# Patient Record
Sex: Male | Born: 1945 | Race: White | Hispanic: No | Marital: Married | Smoking: Former smoker
Health system: Southern US, Community
[De-identification: ages and names within clinical notes are randomized; demographics above are authoritative.]

## PROBLEM LIST (undated history)

## (undated) DIAGNOSIS — I739 Peripheral vascular disease, unspecified: Secondary | ICD-10-CM

## (undated) DIAGNOSIS — G35 Multiple sclerosis: Secondary | ICD-10-CM

## (undated) DIAGNOSIS — J449 Chronic obstructive pulmonary disease, unspecified: Secondary | ICD-10-CM

## (undated) HISTORY — DX: Multiple sclerosis: G35

## (undated) HISTORY — DX: Peripheral vascular disease, unspecified: I73.9

## (undated) HISTORY — DX: Chronic obstructive pulmonary disease, unspecified: J44.9

---

## 2016-08-02 ENCOUNTER — Institutional Professional Consult (permissible substitution): Payer: Self-pay | Admitting: Pulmonary Disease

## 2016-08-11 ENCOUNTER — Encounter: Payer: Self-pay | Admitting: Pulmonary Disease

## 2016-08-11 ENCOUNTER — Ambulatory Visit (INDEPENDENT_AMBULATORY_CARE_PROVIDER_SITE_OTHER)
Admission: RE | Admit: 2016-08-11 | Discharge: 2016-08-11 | Disposition: A | Discharge status: Home / Self Care | Payer: Medicare PPO | Source: Ambulatory Visit | Attending: Pulmonary Disease | Admitting: Pulmonary Disease

## 2016-08-11 ENCOUNTER — Ambulatory Visit (INDEPENDENT_AMBULATORY_CARE_PROVIDER_SITE_OTHER): Payer: Medicare PPO | Admitting: Pulmonary Disease

## 2016-08-11 VITALS — BP 122/64 | HR 148 | Ht 68.0 in | Wt 203.6 lb

## 2016-08-11 DIAGNOSIS — R0602 Shortness of breath: Secondary | ICD-10-CM

## 2016-08-11 DIAGNOSIS — J449 Chronic obstructive pulmonary disease, unspecified: Secondary | ICD-10-CM | POA: Diagnosis not present

## 2016-08-11 DIAGNOSIS — F17200 Nicotine dependence, unspecified, uncomplicated: Secondary | ICD-10-CM

## 2016-08-11 NOTE — Progress Notes (Signed)
David Saunders    [id number]    29-Jan-1946  Primary Care Physician:David Saunders David Dredge, MD  Referring Physician: Abigail Miyamoto, MD [id number] [place], [place] [address]  Chief complaint:  Consult for evaluation of dyspnea  HPI: 71 year old with history of multiple sclerosis, COPD, chronic lung disease. He has been maintained on Symbicort and Spiriva for past several years with stable symptoms of dyspnea on exertion. He denies dyspnea at rest. No cough, sputum production or wheeze. He's never been seen by pulmonologist and does not recall getting pulmonary function tests. Was put on supplemental oxygen at night but is not using this often.  He has history of multiple sclerosis with 1-2 exacerbations every year. He is on copaxone for for this and follows at [facility]. 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 He is an active smoker and continues to smoke half pack per day.  Pets: None, allergic to cats Occupation: Used to work as a Forensic scientist Exposures: Exposure to [employer], nonspecific to asbestos Smoking history: Continues to smoke half pack per day. About 50-pack-year smoking history  Outpatient Encounter Prescriptions as of 08/11/2016  Medication Sig  . albuterol (PROAIR HFA) 108 (90 Base) MCG/ACT inhaler inhale 2 puffs every 6 hours if needed  . buPROPion (WELLBUTRIN XL) 150 MG 24 hr tablet   . Cholecalciferol (VITAMIN D3) 5000 units TABS Take by mouth.  Molli Hazard Acetate (COPAXONE) 40 MG/ML SOSY inject 40 milligrams UNDER THE SKIN 3 TIMES A WEEK  . levocetirizine (XYZAL) 5 MG tablet   . LORazepam (ATIVAN) 1 MG tablet Take 1 pill about 30-40 minutes before MRI, may repeat x 1  . predniSONE (DELTASONE) 5 MG tablet   . SYMBICORT 160-4.5 MCG/ACT inhaler   . tamsulosin (FLOMAX) 0.4 MG CAPS capsule   . Tiotropium Bromide Monohydrate (SPIRIVA RESPIMAT) 2.5 MCG/ACT AERS   . traZODone (DESYREL) 50 MG tablet  Take 150 mg by mouth.   No facility-administered encounter medications on file as of 08/11/2016.     Allergies as of 08/11/2016  . (Not on File)    Past Medical History:  Diagnosis Date  . COPD (chronic obstructive pulmonary disease) (HCC)   . MS (multiple sclerosis) (HCC)     History reviewed. No pertinent surgical history.  Family History  Problem Relation Age of Onset  . Cancer Mother     Social History   Social History  . Marital status: Married    Spouse name: N/A  . Number of children: N/A  . Years of education: N/A   Occupational History  . Not on file.   Social History Main Topics  . Smoking status: Current Every Day Smoker    Packs/day: 0.25    Years: 50.00    Types: Cigarettes  . Smokeless tobacco: Never Used  . Alcohol use No  . Drug use: No  . Sexual activity: Not on file   Other Topics Concern  . Not on file   Social History Narrative  . No narrative on file    Review of systems: Review of Systems  Constitutional: Negative for fever and chills.  HENT: Negative.   Eyes: Negative for blurred vision.  Respiratory: as per HPI  Cardiovascular: Negative for chest pain and palpitations.  Gastrointestinal: Negative for vomiting, diarrhea, blood per rectum. Genitourinary: Negative for dysuria, urgency, frequency and hematuria.  Musculoskeletal: Negative for myalgias, back pain and  joint pain.  Skin: Negative for itching and rash.  Neurological: Negative for dizziness, tremors, focal weakness, seizures and loss of consciousness.  Endo/Heme/Allergies: Negative for environmental allergies.  Psychiatric/Behavioral: Negative for depression, suicidal ideas and hallucinations.  All other systems reviewed and are negative.  Physical Exam: Blood pressure 122/64, pulse (!) 148, height 5\' 8"  (1.727 m), weight 203 lb 9.6 oz (92.4 kg), SpO2 (!) 89 %. Gen:      No acute distress HEENT:  EOMI, sclera anicteric Neck:     No masses; no thyromegaly Lungs:     Reduced air entry; normal respiratory effort CV:         Regular rate and rhythm; no murmurs Abd:      + bowel sounds; soft, non-tender; no palpable masses, no distension Ext:    No edema; adequate peripheral perfusion Skin:      Warm and dry; no rash Neuro: alert and oriented x 3 Psych: normal mood and affect  Data Reviewed: CBC 08/05/16-WBC count 8.6, eosinophils 2%, absolute eosinophil count 0.1 Metabolic panel, LFTs 08/05/16- within normal limits  Assessment:  Eval for COPD.  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 Will evaluate with getting CXR and pulmonary function tests. He'll continue on the Symbicort and Spiriva for now We discussed supplemental oxygen during exertion but he is reluctant to start . Need to address this at next visit  Multiple sclerosis Stable on Copaxone. He is concerned about diaphragm involvement with with this. Can be evaluated by chest x-ray and PFTs  Active smoker Stressed importance of quitting. He will wants to try and quit on his own. He'll be referred for low-dose screening CTs of the chest.  Plan/Recommendations: - Continue symbicort, spiriva - PFTs, CXR - Low dose screening CT of chest.  David Greathouse MD [facility] Pager [phone] 08/11/2016, 4:13 PM  CC: David Saunders

## 2016-08-11 NOTE — Patient Instructions (Addendum)
We will get a chest x-ray today Continue on Symbicort, Spiriva and albuterol rescue inhaler We will get pulmonary function test for further evaluation of lung function Referral for low-dose screening CTs of the chest  Return to clinic in 3 months

## 2016-08-15 ENCOUNTER — Telehealth: Payer: Self-pay | Admitting: Pulmonary Disease

## 2016-08-15 DIAGNOSIS — J60 Coalworker's pneumoconiosis: Secondary | ICD-10-CM

## 2016-08-15 DIAGNOSIS — B399 Histoplasmosis, unspecified: Secondary | ICD-10-CM

## 2016-08-15 DIAGNOSIS — J449 Chronic obstructive pulmonary disease, unspecified: Secondary | ICD-10-CM

## 2016-08-15 NOTE — Telephone Encounter (Signed)
Pt aware of results and voiced his understanding. 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 Pt ask that I make PM aware before ordering CT, however he would agree to CT if PM wants to proceed with CT.   PM please advise. Thanks.   Notes recorded by Velvet Bathe, CMA on 08/12/2016 at 3:07 PM EDT lmtcb X1 for pt. Will order HRCT after speaking to pt. ------  Notes recorded by Chilton Greathouse, MD on 08/12/2016 at 5:58 AM EDT Please let the patient know that the CXR shows COPD and maybe scarring and lung spots. Please order high res CT.For indication use COPD, coal workers pneumoconiosis

## 2016-08-15 NOTE — Telephone Encounter (Signed)
Pt calling once again, said he was returning call for Morrie Sheldon? Please advise.Caren Griffins

## 2016-08-16 NOTE — Telephone Encounter (Signed)
Spoke with pt, aware of PM's recs.  CT ordered as requested.  Nothing further needed.

## 2016-08-16 NOTE — Telephone Encounter (Signed)
Yes. Please proceed with high res CT. It would allow [place] to assess the histoplasmosis as well. Please add histoplasmosis for the ordering indication in addition to COPD and coal workers pneumoconiosis.  Thanks PM

## 2016-08-23 ENCOUNTER — Ambulatory Visit (HOSPITAL_COMMUNITY)
Admission: RE | Admit: 2016-08-23 | Discharge: 2016-08-23 | Disposition: A | Discharge status: Home / Self Care | Payer: Medicare PPO | Source: Ambulatory Visit | Attending: Pulmonary Disease | Admitting: Pulmonary Disease

## 2016-08-23 DIAGNOSIS — J60 Coalworker's pneumoconiosis: Secondary | ICD-10-CM | POA: Insufficient documentation

## 2016-08-23 DIAGNOSIS — R590 Localized enlarged lymph nodes: Secondary | ICD-10-CM | POA: Insufficient documentation

## 2016-08-23 DIAGNOSIS — J449 Chronic obstructive pulmonary disease, unspecified: Secondary | ICD-10-CM | POA: Diagnosis not present

## 2016-08-23 DIAGNOSIS — B399 Histoplasmosis, unspecified: Secondary | ICD-10-CM | POA: Insufficient documentation

## 2016-08-23 DIAGNOSIS — I2510 Atherosclerotic heart disease of native coronary artery without angina pectoris: Secondary | ICD-10-CM | POA: Insufficient documentation

## 2016-08-23 DIAGNOSIS — I700 Atherosclerosis of aorta: Secondary | ICD-10-CM | POA: Diagnosis not present

## 2016-08-23 DIAGNOSIS — J432 Centrilobular emphysema: Secondary | ICD-10-CM | POA: Diagnosis not present

## 2016-08-29 ENCOUNTER — Telehealth: Payer: Self-pay

## 2016-08-29 NOTE — Telephone Encounter (Signed)
Created in error

## 2016-09-13 ENCOUNTER — Telehealth: Payer: Self-pay | Admitting: Pulmonary Disease

## 2016-09-13 NOTE — Telephone Encounter (Signed)
Routing to lung nodule pool for appropriate follow-up.

## 2016-09-13 NOTE — Telephone Encounter (Signed)
No need for screening CT this year. We can cancel order We will out in order at next visit to start screening in 2019.   PM

## 2016-09-13 NOTE — Telephone Encounter (Signed)
Dr Isaiah Serge,  You had sent an order to start pt in lung screening but then pt had a HRCT on 08/23/16.  Please advise if you still want pt followed for lung cancer screening.

## 2016-09-15 NOTE — Telephone Encounter (Signed)
Referral cancelled.  Nothing further needed.

## 2016-12-06 ENCOUNTER — Ambulatory Visit: Payer: Medicare PPO | Admitting: Pulmonary Disease

## 2017-03-09 DIAGNOSIS — G35 Multiple sclerosis: Secondary | ICD-10-CM | POA: Diagnosis not present

## 2017-03-09 DIAGNOSIS — J441 Chronic obstructive pulmonary disease with (acute) exacerbation: Secondary | ICD-10-CM | POA: Diagnosis not present

## 2017-03-09 DIAGNOSIS — J9690 Respiratory failure, unspecified, unspecified whether with hypoxia or hypercapnia: Secondary | ICD-10-CM | POA: Diagnosis not present

## 2017-03-10 DIAGNOSIS — G35 Multiple sclerosis: Secondary | ICD-10-CM | POA: Diagnosis not present

## 2017-03-10 DIAGNOSIS — R0602 Shortness of breath: Secondary | ICD-10-CM | POA: Diagnosis not present

## 2017-03-10 DIAGNOSIS — J9690 Respiratory failure, unspecified, unspecified whether with hypoxia or hypercapnia: Secondary | ICD-10-CM | POA: Diagnosis not present

## 2017-03-10 DIAGNOSIS — J441 Chronic obstructive pulmonary disease with (acute) exacerbation: Secondary | ICD-10-CM | POA: Diagnosis not present

## 2017-03-11 DIAGNOSIS — J9690 Respiratory failure, unspecified, unspecified whether with hypoxia or hypercapnia: Secondary | ICD-10-CM | POA: Diagnosis not present

## 2017-03-11 DIAGNOSIS — J441 Chronic obstructive pulmonary disease with (acute) exacerbation: Secondary | ICD-10-CM | POA: Diagnosis not present

## 2017-03-11 DIAGNOSIS — G35 Multiple sclerosis: Secondary | ICD-10-CM | POA: Diagnosis not present

## 2017-03-12 DIAGNOSIS — J441 Chronic obstructive pulmonary disease with (acute) exacerbation: Secondary | ICD-10-CM | POA: Diagnosis not present

## 2017-03-12 DIAGNOSIS — G35 Multiple sclerosis: Secondary | ICD-10-CM | POA: Diagnosis not present

## 2017-03-12 DIAGNOSIS — J9690 Respiratory failure, unspecified, unspecified whether with hypoxia or hypercapnia: Secondary | ICD-10-CM | POA: Diagnosis not present

## 2017-03-14 DIAGNOSIS — J9690 Respiratory failure, unspecified, unspecified whether with hypoxia or hypercapnia: Secondary | ICD-10-CM | POA: Diagnosis not present

## 2017-03-14 DIAGNOSIS — J441 Chronic obstructive pulmonary disease with (acute) exacerbation: Secondary | ICD-10-CM | POA: Diagnosis not present

## 2017-03-14 DIAGNOSIS — G35 Multiple sclerosis: Secondary | ICD-10-CM | POA: Diagnosis not present

## 2017-03-15 DIAGNOSIS — G35 Multiple sclerosis: Secondary | ICD-10-CM | POA: Diagnosis not present

## 2017-03-15 DIAGNOSIS — J441 Chronic obstructive pulmonary disease with (acute) exacerbation: Secondary | ICD-10-CM | POA: Diagnosis not present

## 2017-03-15 DIAGNOSIS — J9690 Respiratory failure, unspecified, unspecified whether with hypoxia or hypercapnia: Secondary | ICD-10-CM | POA: Diagnosis not present

## 2017-03-16 DIAGNOSIS — J9690 Respiratory failure, unspecified, unspecified whether with hypoxia or hypercapnia: Secondary | ICD-10-CM | POA: Diagnosis not present

## 2017-03-16 DIAGNOSIS — G35 Multiple sclerosis: Secondary | ICD-10-CM | POA: Diagnosis not present

## 2017-03-16 DIAGNOSIS — J441 Chronic obstructive pulmonary disease with (acute) exacerbation: Secondary | ICD-10-CM | POA: Diagnosis not present

## 2017-03-17 DIAGNOSIS — G35 Multiple sclerosis: Secondary | ICD-10-CM | POA: Diagnosis not present

## 2017-03-17 DIAGNOSIS — J441 Chronic obstructive pulmonary disease with (acute) exacerbation: Secondary | ICD-10-CM | POA: Diagnosis not present

## 2017-03-17 DIAGNOSIS — J9690 Respiratory failure, unspecified, unspecified whether with hypoxia or hypercapnia: Secondary | ICD-10-CM | POA: Diagnosis not present

## 2017-03-24 ENCOUNTER — Encounter: Payer: Medicare PPO | Admitting: Vascular Surgery

## 2017-03-30 ENCOUNTER — Encounter: Payer: Self-pay | Admitting: Internal Medicine

## 2017-03-30 ENCOUNTER — Ambulatory Visit (INDEPENDENT_AMBULATORY_CARE_PROVIDER_SITE_OTHER): Payer: Medicare PPO | Admitting: Internal Medicine

## 2017-03-30 ENCOUNTER — Other Ambulatory Visit: Payer: Medicare PPO

## 2017-03-30 VITALS — BP 110/60 | HR 96 | Ht 68.0 in | Wt 186.2 lb

## 2017-03-30 DIAGNOSIS — J449 Chronic obstructive pulmonary disease, unspecified: Secondary | ICD-10-CM

## 2017-03-30 DIAGNOSIS — J439 Emphysema, unspecified: Secondary | ICD-10-CM | POA: Diagnosis not present

## 2017-03-30 DIAGNOSIS — R5381 Other malaise: Secondary | ICD-10-CM

## 2017-03-30 DIAGNOSIS — J9611 Chronic respiratory failure with hypoxia: Secondary | ICD-10-CM

## 2017-03-30 DIAGNOSIS — J9612 Chronic respiratory failure with hypercapnia: Secondary | ICD-10-CM | POA: Diagnosis not present

## 2017-03-30 NOTE — Patient Instructions (Addendum)
ICD-10-CM   1. Chronic obstructive pulmonary disease, unspecified COPD type (HCC) J44.9   2. Chronic respiratory failure with hypoxia and hypercapnia (HCC) J96.11    J96.12   3. Pulmonary emphysema, unspecified emphysema type (HCC) J43.9   4. Physical deconditioning R53.81    Glad better afte recent admit Glad you quit smoking  Plan Continue PT Refer pulm rehab at [facility]; if you do not like it we can change it to cone Check alpha 1AT Continue 02, spiriva and symbicort scheduled   Followup In 3 months do spirometry with BD response and dlco. No lung volumes 3 months with Dr David Saunders your primary pulmonary doctor

## 2017-03-30 NOTE — Addendum Note (Signed)
Addended by: Wyvonne LenzPINION, Latoya Diskin P on: 03/30/2017 10:15 AM   Modules accepted: Orders

## 2017-03-30 NOTE — Progress Notes (Addendum)
Subjective:     Patient ID: David Saunders, male   DOB: 01/28/1946, 71 y.o.   MRN: [id number]  HPI   OV  03/30/2017  Chief Complaint  Patient presents with  . Hospitalization Follow-up    Pt was recently hospitalized due to acute exacerbation of COPD and resp fail. Pt was in the hospital x7 days. States that he has been very weak since been out of the hospital, breathing is improving but is still real SOB. Denies any CP and has very little cough. Pt is doing PT.  DME: [employer], 3L O37.     71 year old male with COPD and multiple sclerosis.  He presents with his wife and daughter.  They are here for post hospital follow-up.  He is a patient of Dr. Isaiah Serge last seen for COPD not otherwise specified in May 2018.  At baseline patient is not on oxygen.  He tells me that for several months he has had worsening pedal edema and excoriations and seepage of edema.  He then had syncopal episode and then was admitted to [facility] on March 10, 2017 and treated for COPD exacerbation with BiPAP for a week and then discharged.  At discharge she was newly sent on oxygen 3 L nasal cannula.  Is getting home physical therapy.  Despite his COPD and multiple sclerosis he is able to walk around and is slowly improving.  He does not use any assistive devices such as cane or walker.  He finally quit smoking with this admission.  He is interested in pulmonary rehabilitation although he does express some concerns about the quality of care at [facility]  He is on Spiriva and Symbicort and oxygen therapy currently.  Overall he is feeling better.  Of note, discharge notes that I reviewed showed that he was hypercarbic and he was sent home on trilogy ventilator BiPAP to use at night which she states he is compliant with. cAT score 21 currently and lot of this is dyspnea/fatigue    CAT COPD Symptom & Quality of Life Score (GSK trademark) 0 is no burden. 5 is highest burden 03/30/2017   Never Cough -> Cough all  the time 1  No phlegm in chest -> Chest is full of phlegm 0  No chest tightness -> Chest feels very tight 3  No dyspnea for 1 flight stairs/hill -> Very dyspneic for 1 flight of stairs 5  No limitations for ADL at home -> Very limited with ADL at home 4  Confident leaving home -> Not at all confident leaving home 3  Sleep soundly -> Do not sleep soundly because of lung condition 1  Lots of Energy -> No energy at all 4  TOTAL Score (max 40)  21       has a past medical history of COPD (chronic obstructive pulmonary disease) (HCC) and MS (multiple sclerosis) (HCC).   reports that he quit smoking about 2 weeks ago. His smoking use included cigarettes. He has a 12.50 pack-year smoking history. he has never used smokeless tobacco.  No past surgical history on file.  Not on File  Immunization History  Administered Date(s) Administered  . Influenza, High Dose Seasonal PF 02/22/2016, 03/17/2017    Family History  Problem Relation Age of Onset  . Cancer Mother      Current Outpatient Medications:  .  albuterol (PROAIR HFA) 108 (90 Base) MCG/ACT inhaler, inhale 2 puffs every 6 hours if needed, Disp: , Rfl:  .  atorvastatin (LIPITOR) 20 MG  tablet, Take 20 mg by mouth daily., Disp: , Rfl: 0 .  buPROPion (WELLBUTRIN XL) 150 MG 24 hr tablet, , Disp: , Rfl:  .  busPIRone (BUSPAR) 10 MG tablet, Take 10 mg by mouth 2 (two) times daily., Disp: , Rfl: 0 .  Cholecalciferol (VITAMIN D3) 5000 units TABS, Take by mouth., Disp: , Rfl:  .  clonazePAM (KLONOPIN) 1 MG tablet, take 1 tablet by mouth three times a day if needed for anxiety, Disp: , Rfl: 0 .  Glatiramer Acetate (COPAXONE) 40 MG/ML SOSY, inject 40 milligrams UNDER THE SKIN 3 TIMES A WEEK, Disp: , Rfl:  .  HYDROcodone-acetaminophen (NORCO/VICODIN) 5-325 MG tablet, take 1 tablet by mouth every 6 to 8 hours if needed, Disp: , Rfl: 0 .  levocetirizine (XYZAL) 5 MG tablet, , Disp: , Rfl:  .  LORazepam (ATIVAN) 1 MG tablet, Take 1 pill about  30-40 minutes before MRI, may repeat x 1, Disp: , Rfl:  .  predniSONE (DELTASONE) 5 MG tablet, , Disp: , Rfl:  .  SYMBICORT 160-4.5 MCG/ACT inhaler, , Disp: , Rfl:  .  tamsulosin (FLOMAX) 0.4 MG CAPS capsule, , Disp: , Rfl:  .  Tiotropium Bromide Monohydrate (SPIRIVA RESPIMAT) 2.5 MCG/ACT AERS, , Disp: , Rfl:  .  traZODone (DESYREL) 50 MG tablet, Take 150 mg by mouth., Disp: , Rfl:   Review of Systems       Objective:   Physical Exam  Constitutional: He is oriented to person, place, and time. He appears well-developed and well-nourished. No distress.  HENT:  Head: Normocephalic and atraumatic.  Right Ear: External ear normal.  Left Ear: External ear normal.  Mouth/Throat: Oropharynx is clear and moist. No oropharyngeal exudate.  o2 on  Eyes: Conjunctivae and EOM are normal. Pupils are equal, round, and reactive to light. Right eye exhibits no discharge. Left eye exhibits no discharge. No scleral icterus.  Neck: Normal range of motion. Neck supple. No JVD present. No tracheal deviation present. No thyromegaly present.  Cardiovascular: Normal rate, regular rhythm and intact distal pulses. Exam reveals no gallop and no friction rub.  No murmur heard. Pulmonary/Chest: Effort normal and breath sounds normal. No respiratory distress. He has no wheezes. He has no rales. He exhibits no tenderness.  barrell chest +  Abdominal: Soft. Bowel sounds are normal. He exhibits no distension and no mass. There is no tenderness. There is no rebound and no guarding.  Musculoskeletal: Normal range of motion. He exhibits edema. He exhibits no tenderness.  Bilateral LE in ace wrap  Lymphadenopathy:    He has no cervical adenopathy.  Neurological: He is alert and oriented to person, place, and time. He has normal reflexes. No cranial nerve deficit. Coordination normal.  Skin: Skin is warm and dry. No rash noted. He is not diaphoretic. No erythema. No pallor.  Psychiatric: He has a normal mood and affect.  His behavior is normal. Judgment and thought content normal.  Nursing note and vitals reviewed.  Vitals:   03/30/17 0930  BP: 110/60  Pulse: 96  SpO2: 91%  Weight: 186 lb 3.2 oz (84.5 kg)  Height: 5\' 8"  (1.727 m)    Estimated body mass index is 28.31 kg/m as calculated from the following:   Height as of this encounter: 5\' 8"  (1.727 m).   Weight as of this encounter: 186 lb 3.2 oz (84.5 kg).     Assessment:       ICD-10-CM   1. Chronic obstructive pulmonary disease, unspecified COPD type (  HCC) J44.9 Alpha-1 antitrypsin phenotype  2. Chronic respiratory failure with hypoxia and hypercapnia (HCC) J96.11 Alpha-1 antitrypsin phenotype   J96.12   3. Pulmonary emphysema, unspecified emphysema type (HCC) J43.9 Alpha-1 antitrypsin phenotype  4. Physical deconditioning R53.81        Plan:      Glad better afte recent admit Glad you quit smoking  Plan Continue PT Refer pulm rehab at [facility]; if you do not like it we can change it to cone Check alpha 1AT Continue 02, spiriva and symbicort scheduled   Followup In 3 months do spirometry with BD response and dlco. No lung volumes 3 months with Dr Isaiah Serge your primary pulmonary doctor    > 50% of this > 25 min visit spent in face to face counseling or coordination of care   Dr. Kalman Shan, M.D., [facility].C.P Pulmonary and Critical Care Medicine Staff Physician, [facility] Center Director - Interstitial Lung Disease  Program  Pulmonary Fibrosis [facility] Network at [facility] [place], [place], [address]  Pager: [phone], If no answer or between  15:00h - 7:00h: call [phone] Telephone: [phone]

## 2017-04-06 LAB — ALPHA-1 ANTITRYPSIN PHENOTYPE: A1 ANTITRYPSIN SER: 182 mg/dL (ref 83–199)

## 2017-05-15 ENCOUNTER — Ambulatory Visit (INDEPENDENT_AMBULATORY_CARE_PROVIDER_SITE_OTHER): Payer: Medicare PPO | Admitting: Internal Medicine

## 2017-05-15 ENCOUNTER — Encounter: Payer: Self-pay | Admitting: Internal Medicine

## 2017-05-15 ENCOUNTER — Telehealth: Payer: Self-pay | Admitting: Internal Medicine

## 2017-05-15 ENCOUNTER — Ambulatory Visit (HOSPITAL_COMMUNITY)
Admission: RE | Admit: 2017-05-15 | Discharge: 2017-05-15 | Disposition: A | Discharge status: Home / Self Care | Payer: Medicare PPO | Source: Ambulatory Visit | Attending: Internal Medicine | Admitting: Internal Medicine

## 2017-05-15 ENCOUNTER — Other Ambulatory Visit (INDEPENDENT_AMBULATORY_CARE_PROVIDER_SITE_OTHER): Payer: Medicare PPO

## 2017-05-15 VITALS — BP 126/76 | HR 106 | Ht 68.0 in | Wt 192.0 lb

## 2017-05-15 DIAGNOSIS — J9612 Chronic respiratory failure with hypercapnia: Secondary | ICD-10-CM | POA: Diagnosis not present

## 2017-05-15 DIAGNOSIS — J439 Emphysema, unspecified: Secondary | ICD-10-CM | POA: Diagnosis not present

## 2017-05-15 DIAGNOSIS — I739 Peripheral vascular disease, unspecified: Secondary | ICD-10-CM | POA: Diagnosis not present

## 2017-05-15 DIAGNOSIS — J9611 Chronic respiratory failure with hypoxia: Secondary | ICD-10-CM | POA: Insufficient documentation

## 2017-05-15 DIAGNOSIS — Z01811 Encounter for preprocedural respiratory examination: Secondary | ICD-10-CM

## 2017-05-15 LAB — CBC WITH DIFFERENTIAL/PLATELET
BASOS ABS: 0.1 10*3/uL (ref 0.0–0.1)
Basophils Relative: 0.7 % (ref 0.0–3.0)
EOS ABS: 0.1 10*3/uL (ref 0.0–0.7)
Eosinophils Relative: 0.8 % (ref 0.0–5.0)
HCT: 40.4 % (ref 39.0–52.0)
HEMOGLOBIN: 13.7 g/dL (ref 13.0–17.0)
LYMPHS ABS: 1.9 10*3/uL (ref 0.7–4.0)
Lymphocytes Relative: 18.5 % (ref 12.0–46.0)
MCHC: 33.9 g/dL (ref 30.0–36.0)
MCV: 95.6 fl (ref 78.0–100.0)
MONO ABS: 0.7 10*3/uL (ref 0.1–1.0)
Monocytes Relative: 6.3 % (ref 3.0–12.0)
NEUTROS PCT: 73.7 % (ref 43.0–77.0)
Neutro Abs: 7.7 10*3/uL (ref 1.4–7.7)
Platelets: 203 10*3/uL (ref 150.0–400.0)
RBC: 4.23 Mil/uL (ref 4.22–5.81)
RDW: 14.7 % (ref 11.5–15.5)
WBC: 10.5 10*3/uL (ref 4.0–10.5)

## 2017-05-15 LAB — BASIC METABOLIC PANEL
BUN: 15 mg/dL (ref 6–23)
CO2: 29 mEq/L (ref 19–32)
Calcium: 9.8 mg/dL (ref 8.4–10.5)
Chloride: 91 mEq/L — ABNORMAL LOW (ref 96–112)
Creatinine, Ser: 0.66 mg/dL (ref 0.40–1.50)
GFR: 126.76 mL/min (ref >60.00)
Glucose, Bld: 97 mg/dL (ref 70–99)
POTASSIUM: 4.2 meq/L (ref 3.5–5.1)
SODIUM: 129 meq/L — AB (ref 135–145)

## 2017-05-15 LAB — BLOOD GAS, ARTERIAL
ACID-BASE EXCESS: 3.6 mmol/L — AB (ref 0.0–2.0)
Bicarbonate: 28.1 mmol/L — ABNORMAL HIGH (ref 20.0–28.0)
DRAWN BY: 27021
O2 CONTENT: 3 L/min
O2 SAT: 92.8 %
PATIENT TEMPERATURE: 98.6
pCO2 arterial: 43.6 mmHg (ref 32.0–48.0)
pH, Arterial: 7.424 (ref 7.350–7.450)
pO2, Arterial: 67.6 mmHg — ABNORMAL LOW (ref 83.0–108.0)

## 2017-05-15 LAB — HEPATIC FUNCTION PANEL
ALBUMIN: 3.9 g/dL (ref 3.5–5.2)
ALT: 24 U/L (ref 0–53)
AST: 26 U/L (ref 0–37)
Alkaline Phosphatase: 46 U/L (ref 39–117)
Bilirubin, Direct: 0.2 mg/dL (ref 0.0–0.3)
TOTAL PROTEIN: 7.3 g/dL (ref 6.0–8.3)
Total Bilirubin: 0.9 mg/dL (ref 0.2–1.2)

## 2017-05-15 NOTE — Telephone Encounter (Signed)
Ok with me 

## 2017-05-15 NOTE — Patient Instructions (Signed)
Chronic respiratory failure with hypoxia and hypercapnia (HCC) Pulmonary emphysema, unspecified emphysema type (HCC)   - stable , continue regular bronchodilators - ok to give holiday from TRIlOGY ventilator tonight and if doing ok use it for rest of week and can challenge yourself at beach without it but do take machine with you just in case  Preoperative respiratory examination - any opening of chest or abdomen for vascular surgery can increaes risk significantly - any surgery > 3h also increases risk significantly - need to understand your surgery better; please have Dr Willette Pa call me - check cbc, bmet, lft and abg   PAD (peripheral artery disease) (HCC)  - refer DR Early or anyone in [facility] group for 2nd opinion  Followup  - 2 -4 weeks with primary pulmonary Dr Isaiah Serge or myself

## 2017-05-15 NOTE — Telephone Encounter (Signed)
Called and spoke with patient Scheduled 2 week f/u with MR Nothing further needed

## 2017-05-15 NOTE — Telephone Encounter (Signed)
Fine with me  Dr. Kalman Shan, M.D., [facility].C.P Pulmonary and Critical Care Medicine Staff Physician, [facility] Center Director - Interstitial Lung Disease  Program  Pulmonary Fibrosis [facility] Network at [facility] [place], [place], [address]  Pager: [phone], If no answer or between  15:00h - 7:00h: call [phone] Telephone: [phone]

## 2017-05-15 NOTE — Telephone Encounter (Signed)
Noted  

## 2017-05-15 NOTE — Telephone Encounter (Signed)
ATC pt, no answer. Left message for pt to call back to make an appt with MR in the 2-4 weeks.   FYI to [place] and MR

## 2017-05-15 NOTE — Telephone Encounter (Signed)
Pt called back and sched a 2-4 wk f/up for 05/29/2017 with MR.

## 2017-05-15 NOTE — Telephone Encounter (Signed)
Patient is a current David Saunders patient but is wanting to switch providers to Dr. Marchelle Gearing.  David Saunders, please advise if you are okay with the switch and Dr. Monica Becton, please advise if you are fine taking patient on as yours.  Thanks!

## 2017-05-15 NOTE — Progress Notes (Addendum)
Subjective:     Patient ID: David Saunders, male   DOB: September 17, 1945, 72 y.o.   MRN: [id number]  HPI         OV  03/30/2017  Chief Complaint  Patient presents with  . Hospitalization Follow-up    Pt was recently hospitalized due to acute exacerbation of COPD and resp fail. Pt was in the hospital x7 days. States that he has been very weak since been out of the hospital, breathing is improving but is still real SOB. Denies any CP and has very little cough. Pt is doing PT.  DME: [employer], 3L O37.     72 year old male with COPD and multiple sclerosis.  He presents with his wife and daughter.  They are here for post hospital follow-up.  He is a patient of Dr. Isaiah Serge last seen for COPD not otherwise specified in May 2018.  At baseline patient is not on oxygen.  He tells me that for several months he has had worsening pedal edema and excoriations and seepage of edema.  He then had syncopal episode and then was admitted to [facility] on March 10, 2017 and treated for COPD exacerbation with BiPAP for a week and then discharged.  At discharge she was newly sent on oxygen 3 L nasal cannula.  Is getting home physical therapy.  Despite his COPD and multiple sclerosis he is able to walk around and is slowly improving.  He does not use any assistive devices such as cane or walker.  He finally quit smoking with this admission.  He is interested in pulmonary rehabilitation although he does express some concerns about the quality of care at [facility].  He is on Spiriva and Symbicort and oxygen therapy currently.  Overall he is feeling better.  Of note, discharge notes that I reviewed showed that he was hypercarbic and he was sent home on trilogy ventilator BiPAP to use at night which she states he is compliant with. cAT score 21 currently and lot of this is dyspnea/fatigue   OV 05/15/2017 - acute visit preop clearance - new issue  Chief Complaint  Patient presents with  . Advice Only    Pt is  here for surgical clearance per Dr. Willette Pa.  Pt states his SOB is bad but is the same since last visit. Pt has been going to rehab. Denies any cough or CP.    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  It is a new issue of preoperative pulmonary consultation.  His regular pulmonologist is Dr. Isaiah Serge but he wanted this evaluation and so he made his acute visit.  He is here with his wife.  According to his wife and he he has nonhealing leg ulcers and poor circulation and peripheral arterial in his lower extremities.  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  Apparently his scan has been evaluated by Dr. Willette Pa at [facility] and has been advised about open thoracic surgery that could last 3-4 hours.  However he and his wife state that they have yet to see Dr. Willette Pa and an appointment is pending.  He is extremely worried about the surgery.  He is wondering about a second opinion within the vascular service community in [place]  In terms of his operative risk I noticed that his age is 65 he is  on 3 L oxygen he is on BiPAP at night.  He is on advanced COPD which he says is stable.  I do not have access to his nutrition status or renal status or anemia.  It sounds like a surgery might involve open cardiac and might be greater than 3 or 4 hours.  CAT COPD Symptom & Quality of Life Score (GSK trademark) 0 is no burden. 5 is highest burden 03/30/2017  05/15/2017   Never Cough -> Cough all the time 1 2  No phlegm in chest -> Chest is full of phlegm 0 2  No chest tightness -> Chest feels very tight 3 5  No dyspnea for 1 flight stairs/hill -> Very dyspneic for 1 flight of stairs 5 5  No limitations for ADL at home -> Very limited with ADL at home 4 5  Confident leaving home -> Not at all confident leaving home 3  5  Sleep soundly -> Do not sleep soundly because of lung condition 1 4  Lots of Energy -> No energy at all 4 5  TOTAL Score (max 40)  21 33       has a past medical history of COPD (chronic obstructive pulmonary disease) (HCC) and MS (multiple sclerosis) (HCC).   reports that he quit smoking about 2 months ago. His smoking use included cigarettes. He has a 12.50 pack-year smoking history. he has never used smokeless tobacco.  No past surgical history on file.  Not on File  Immunization History  Administered Date(s) Administered  . Influenza, High Dose Seasonal PF 02/22/2016, 03/17/2017    Family History  Problem Relation Age of Onset  . Cancer Mother      Current Outpatient Medications:  .  albuterol (PROAIR HFA) 108 (90 Base) MCG/ACT inhaler, inhale 2 puffs every 6 hours if needed, Disp: , Rfl:  .  atorvastatin (LIPITOR) 20 MG tablet, Take 20 mg by mouth daily., Disp: , Rfl: 0 .  buPROPion (WELLBUTRIN XL) 150 MG 24 hr tablet, , Disp: , Rfl:  .  Cholecalciferol (VITAMIN D3) 5000 units TABS, Take by mouth., Disp: , Rfl:  .  gabapentin (NEURONTIN) 600 MG tablet, gabapentin 600 mg tablet, Disp: , Rfl:  .  Glatiramer Acetate (COPAXONE) 40 MG/ML SOSY, inject 40 milligrams UNDER THE SKIN 3 TIMES A WEEK, Disp: , Rfl:  .  HYDROcodone-acetaminophen (NORCO/VICODIN) 5-325 MG tablet, hydrocodone 5 mg-acetaminophen 325 mg tablet, Disp: , Rfl:  .  ipratropium-albuterol (DUONEB) 0.5-2.5 (3) MG/3ML SOLN, ipratropium-albuterol 0.5 mg-3 mg(2.5 mg base)/3 mL nebulization soln, Disp: , Rfl:  .  predniSONE (DELTASONE) 5 MG tablet, , Disp: , Rfl:  .  SYMBICORT 160-4.5 MCG/ACT inhaler, , Disp: , Rfl:  .  tamsulosin (FLOMAX) 0.4 MG CAPS capsule, , Disp: , Rfl:  .  Tiotropium Bromide Monohydrate (SPIRIVA RESPIMAT) 2.5 MCG/ACT AERS, , Disp: , Rfl:     Review of Systems     Objective:   Physical Exam  Constitutional: He is oriented to person, place, and time. He appears well-developed and  well-nourished. No distress.  HENT:  Head: Normocephalic and atraumatic.  Right Ear: External ear normal.  Left Ear: External ear normal.  Mouth/Throat: Oropharynx is clear and moist. No oropharyngeal exudate.  3L [place] on  Eyes: Conjunctivae and EOM are normal. Pupils are equal, round, and reactive to light. Right eye exhibits no discharge. Left eye exhibits no discharge. No scleral icterus.  Neck: Normal range of motion. Neck supple. No JVD present. No tracheal deviation present. No  thyromegaly present.  Cardiovascular: Normal rate, regular rhythm and intact distal pulses. Exam reveals no gallop and no friction rub.  No murmur heard. Pulmonary/Chest: Effort normal and breath sounds normal. No respiratory distress. He has no wheezes. He has no rales. He exhibits no tenderness.  barrell chest AE very diminished Prolonged expiration No wheeze Purse lip breathing  Abdominal: Soft. Bowel sounds are normal. He exhibits no distension and no mass. There is no tenderness. There is no rebound and no guarding.  Musculoskeletal: Normal range of motion. He exhibits no edema or tenderness.  Lymphadenopathy:    He has no cervical adenopathy.  Neurological: He is alert and oriented to person, place, and time. He has normal reflexes. No cranial nerve deficit. Coordination normal.  Skin: Skin is warm and dry. No rash noted. He is not diaphoretic. No erythema. No pallor.  Psychiatric: He has a normal mood and affect. His behavior is normal. Judgment and thought content normal.  Nursing note and vitals reviewed.  Vitals:   05/15/17 1119  BP: 126/76  Pulse: (!) 106  SpO2: (!) 88%  Weight: 192 lb (87.1 kg)  Height: 5\' 8"  (1.727 m)    Estimated body mass index is 29.19 kg/m as calculated from the following:   Height as of this encounter: 5\' 8"  (1.727 m).   Weight as of this encounter: 192 lb (87.1 kg).      Assessment:       ICD-10-CM   1. Chronic respiratory failure with hypoxia and hypercapnia  (HCC) J96.11    J96.12   2. Pulmonary emphysema, unspecified emphysema type (HCC) J43.9   3. Preoperative respiratory examination Z01.811   4. PAD (peripheral artery disease) (HCC) I73.9        Plan:     Chronic respiratory failure with hypoxia and hypercapnia (HCC) Pulmonary emphysema, unspecified emphysema type (HCC)   - stable , continue regular bronchodilators - ok to give holiday from TRIlOGY ventilator tonight and if doing ok use it for rest of week and can challenge yourself at beach without it but do take machine with you just in case  Preoperative respiratory examination - new issue - any opening of chest or abdomen for vascular surgery can increaes risk significantly - any surgery > 3h also increases risk significantly - need to understand your surgery better; please have Dr Willette Pa call me - check cbc, bmet, lft and abg to better assess risk  PAD (peripheral artery disease) (HCC) - new issue  - refer DR Early or anyone in [facility] group for 2nd opinion  Followup  - 2 -4 weeks with primary pulmonary Dr Isaiah Serge or myself    Dr. Kalman Shan, M.D., [facility].C.P Pulmonary and Critical Care Medicine Staff Physician, [facility] Director - Interstitial Lung Disease  Program  Pulmonary Fibrosis [facility] at [facility] [place], [place], [address]  Pager: [phone], If no answer or between  15:00h - 7:00h: call [phone] Telephone: [phone]

## 2017-05-16 ENCOUNTER — Encounter: Payer: Self-pay | Admitting: Internal Medicine

## 2017-05-24 ENCOUNTER — Ambulatory Visit: Payer: Medicare PPO | Admitting: Internal Medicine

## 2017-05-24 ENCOUNTER — Other Ambulatory Visit: Payer: Self-pay | Admitting: Medical Oncology

## 2017-05-24 ENCOUNTER — Other Ambulatory Visit: Payer: Self-pay

## 2017-05-24 DIAGNOSIS — R918 Other nonspecific abnormal finding of lung field: Secondary | ICD-10-CM

## 2017-05-24 DIAGNOSIS — I739 Peripheral vascular disease, unspecified: Secondary | ICD-10-CM

## 2017-05-25 ENCOUNTER — Inpatient Hospital Stay: Payer: Medicare PPO | Attending: Internal Medicine

## 2017-05-25 ENCOUNTER — Inpatient Hospital Stay: Payer: Medicare PPO | Admitting: Internal Medicine

## 2017-05-25 ENCOUNTER — Telehealth: Payer: Self-pay | Admitting: Internal Medicine

## 2017-05-25 ENCOUNTER — Telehealth: Payer: Self-pay | Admitting: *Deleted

## 2017-05-25 ENCOUNTER — Encounter: Payer: Self-pay | Admitting: *Deleted

## 2017-05-25 DIAGNOSIS — R918 Other nonspecific abnormal finding of lung field: Secondary | ICD-10-CM | POA: Diagnosis present

## 2017-05-25 LAB — CMP (CANCER CENTER ONLY)
ALBUMIN: 3.6 g/dL (ref 3.5–5.0)
ALK PHOS: 51 U/L (ref 40–150)
ALT: 24 U/L (ref 0–55)
AST: 36 U/L — ABNORMAL HIGH (ref 5–34)
Anion gap: 10 (ref 3–11)
BILIRUBIN TOTAL: 0.9 mg/dL (ref 0.2–1.2)
BUN: 12 mg/dL (ref 7–26)
CALCIUM: 9.2 mg/dL (ref 8.4–10.4)
CO2: 27 mmol/L (ref 22–29)
CREATININE: 0.71 mg/dL (ref 0.70–1.30)
Chloride: 91 mmol/L — ABNORMAL LOW (ref 98–109)
GFR, Est AFR Am: >60 mL/min (ref >60)
GFR, Estimated: >60 mL/min (ref >60)
GLUCOSE: 85 mg/dL (ref 70–140)
Potassium: 4.3 mmol/L (ref 3.5–5.1)
Sodium: 128 mmol/L — ABNORMAL LOW (ref 136–145)
TOTAL PROTEIN: 6.9 g/dL (ref 6.4–8.3)

## 2017-05-25 LAB — CBC WITH DIFFERENTIAL (CANCER CENTER ONLY)
BASOS ABS: 0 10*3/uL (ref 0.0–0.1)
BASOS PCT: 0 %
EOS ABS: 0.2 10*3/uL (ref 0.0–0.5)
EOS PCT: 2 %
HCT: 39 % (ref 38.4–49.9)
Hemoglobin: 13.1 g/dL (ref 13.0–17.1)
Lymphocytes Relative: 20 %
Lymphs Abs: 1.9 10*3/uL (ref 0.9–3.3)
MCH: 32.2 pg (ref 27.2–33.4)
MCHC: 33.6 g/dL (ref 32.0–36.0)
MCV: 95.8 fL (ref 79.3–98.0)
MONO ABS: 0.8 10*3/uL (ref 0.1–0.9)
Monocytes Relative: 8 %
Neutro Abs: 6.6 10*3/uL — ABNORMAL HIGH (ref 1.5–6.5)
Neutrophils Relative %: 70 %
PLATELETS: 183 10*3/uL (ref 140–400)
RBC: 4.07 MIL/uL — ABNORMAL LOW (ref 4.20–5.82)
RDW: 14.7 % — AB (ref 11.0–14.6)
WBC Count: 9.4 10*3/uL (ref 4.0–10.3)

## 2017-05-25 NOTE — Progress Notes (Signed)
Oncology Nurse Navigator Documentation  Oncology Nurse Navigator Flowsheets 05/25/2017  Navigator Location [facility]  Navigator Encounter Type Lobby/I spoke with patient today.  He does not have a DX of lung cancer and does not need to be seen by Dr. Arbutus Ped. Patient states he thought he was here to see him to help with his breathing.  I stated Dr. Marchelle Gearing will help with that and to contact him.  I updated Dr. Marchelle Gearing and Dr. Arbutus Ped.   Barriers/Navigation Needs Education;Coordination of Care  Education Other  Interventions Coordination of Care;Education  Coordination of Care Other  Acuity Level 2  Time Spent with Patient 30

## 2017-05-25 NOTE — Telephone Encounter (Signed)
Pt returning call. Cb is [phone]

## 2017-05-25 NOTE — Telephone Encounter (Signed)
Called and spoke with pt. Pt states that our office referred him to Oncology. Pt states Dr. Ena Dawley office contact him regarding scheduling referral.I do not see within pt's chart where referral was placed for Oncology. Apt was canceled by Dr. Ena Dawley office after labs were drawn. Pt states that Dr. Ena Dawley office will refund him for this visit.   Will route to PCC's to research this further

## 2017-05-25 NOTE — Telephone Encounter (Signed)
Received a message from MR wanting to know who referred pt to see Dr.Mohamed with Oncology.  Pt had appt scheduled today, 05/25/17 at 11:30.  Have called and left a message for pt to return our call so I can discuss this appt referral with them to see if I can get more information from pt once they return my call.

## 2017-05-25 NOTE — Telephone Encounter (Signed)
Oncology Nurse Navigator Documentation  Oncology Nurse Navigator Flowsheets 05/25/2017  Navigator Location [facility]  Navigator Encounter Type Telephone/I called Dr. Marchelle Gearing regarding appt referral for Mr. Munro.  He was not clear no why he was referred but will call me back.  I still have not heard from him or his office. I called Mr. Centrone but was unable to reach him.   Telephone Outgoing Call  Barriers/Navigation Needs Coordination of Care  Interventions Coordination of Care  Coordination of Care Other  Acuity Level 1  Time Spent with Patient 15

## 2017-05-26 NOTE — Telephone Encounter (Signed)
I spoke to mr Sandeen he is aware that [facility] can not get him in to see them before 06/28/17 but they are placing him on a call list incase of a cancellation his appt for Monday has been move to 07/11/17 to see dr Huel Coventry

## 2017-05-26 NOTE — Telephone Encounter (Signed)
We have spoken to pt and he is awre [facility]

## 2017-05-26 NOTE — Telephone Encounter (Signed)
Spoke to pt he is aware there was a big mix up with his appt I apologized to him and then we discussed the appt for [facility] it is 07/08/17 both pt and dr MR want this appt sooner I called [facility] and requested a sooner appt waiting on call back for the appt Tobe Sos

## 2017-05-26 NOTE — Telephone Encounter (Signed)
Spoke with MR regarding pt having an appt scheduled Monday, 05/29/17 at 9:15.  Asked if he still needed to come for that appt since he has not seen vascular yet.  MR stated to me to have pt come in after his referral with vascular surgery.  Changed pt's appt to April 2 at 10:45.

## 2017-05-29 ENCOUNTER — Ambulatory Visit: Payer: Medicare PPO | Admitting: Internal Medicine

## 2017-06-28 ENCOUNTER — Encounter: Payer: Self-pay | Admitting: Vascular Surgery

## 2017-06-28 ENCOUNTER — Ambulatory Visit: Payer: Medicare PPO | Admitting: Vascular Surgery

## 2017-06-28 ENCOUNTER — Ambulatory Visit (HOSPITAL_COMMUNITY)
Admission: RE | Admit: 2017-06-28 | Discharge: 2017-06-28 | Disposition: A | Discharge status: Home / Self Care | Payer: Medicare PPO | Source: Ambulatory Visit | Attending: Vascular Surgery | Admitting: Vascular Surgery

## 2017-06-28 ENCOUNTER — Other Ambulatory Visit: Payer: Self-pay

## 2017-06-28 VITALS — BP 129/67 | HR 119 | Temp 98.5°F | Resp 20 | Ht 68.0 in | Wt 186.9 lb

## 2017-06-28 DIAGNOSIS — I7409 Other arterial embolism and thrombosis of abdominal aorta: Secondary | ICD-10-CM | POA: Diagnosis not present

## 2017-06-28 DIAGNOSIS — I998 Other disorder of circulatory system: Secondary | ICD-10-CM | POA: Insufficient documentation

## 2017-06-28 DIAGNOSIS — I739 Peripheral vascular disease, unspecified: Secondary | ICD-10-CM | POA: Diagnosis not present

## 2017-06-28 NOTE — Progress Notes (Signed)
Patient name: David Saunders MRN: [id number] DOB: Jun 09, 1945 Sex: male   REASON FOR CONSULT:    Peripheral arterial disease.  The consult was requested by Dr. Marchelle Gearing.  HPI:   David Saunders is a pleasant 72 y.o. male, who presents with nonhealing wounds of both lower extremities.  This patient was hospitalized in September in [place] with respiratory failure.  He had significant bilateral lower extremity swelling and developed blisters which ultimately became open wounds.  He has an extensive nonhealing wound over his left Achilles and also several punctate ulcers on his right leg.  He has significant pain associated with these ulcers.  There has been no significant evidence of wound healing.  He presents for vascular evaluation.  I have reviewed the notes that were sent from the referring office.  Patient has a history of significant COPD.  He had a recent exacerbation and was discharged on a prednisone taper.  He also has chronic systolic heart failure with ejection fraction of 40-45%.  GFR December 2018 showed normal renal function.  He did have an arterial Doppler study done in December 2018 which showed an ABI of 41% on the right and 47% on the left.  Of note,  This patient has severe COPD and is on continuous home O2.  He was told that he should not have any surgery lasting longer than 3 hours because of his pulmonary status.  I do not get any history of claudication although his activity is very limited and he does not walk much.  He does have rest pain in both feet.  Past Medical History:  Diagnosis Date  . COPD (chronic obstructive pulmonary disease) (HCC)   . MS (multiple sclerosis) (HCC)   . Peripheral vascular disease (HCC)     Family History  Problem Relation Age of Onset  . Cancer Mother   He denies any family history of premature cardiovascular disease.  SOCIAL HISTORY: He quit smoking 6 months ago. Social History   Socioeconomic History  . Marital status:  Married    Spouse name: Not on file  . Number of children: Not on file  . Years of education: Not on file  . Highest education level: Not on file  Social Needs  . Financial resource strain: Not on file  . Food insecurity - worry: Not on file  . Food insecurity - inability: Not on file  . Transportation needs - medical: Not on file  . Transportation needs - non-medical: Not on file  Occupational History  . Not on file  Tobacco Use  . Smoking status: Former Smoker    Packs/day: 0.25    Years: 50.00    Pack years: 12.50    Types: Cigarettes    Last attempt to quit: 03/10/2017    Years since quitting: 0.3  . Smokeless tobacco: Never Used  Substance and Sexual Activity  . Alcohol use: No  . Drug use: No  . Sexual activity: Not on file  Other Topics Concern  . Not on file  Social History Narrative  . Not on file    No Known Allergies  Current Outpatient Medications  Medication Sig Dispense Refill  . albuterol (PROAIR HFA) 108 (90 Base) MCG/ACT inhaler inhale 2 puffs every 6 hours if needed    . atorvastatin (LIPITOR) 20 MG tablet Take 20 mg by mouth daily.  0  . buPROPion (WELLBUTRIN XL) 150 MG 24 hr tablet     . Cholecalciferol (VITAMIN D3) 5000 units TABS  Take by mouth.    . gabapentin (NEURONTIN) 600 MG tablet gabapentin 600 mg tablet    . Glatiramer Acetate (COPAXONE) 40 MG/ML SOSY inject 40 milligrams UNDER THE SKIN 3 TIMES A WEEK    . HYDROcodone-acetaminophen (NORCO/VICODIN) 5-325 MG tablet hydrocodone 5 mg-acetaminophen 325 mg tablet    . ipratropium-albuterol (DUONEB) 0.5-2.5 (3) MG/3ML SOLN ipratropium-albuterol 0.5 mg-3 mg(2.5 mg base)/3 mL nebulization soln    . predniSONE (DELTASONE) 5 MG tablet     . SYMBICORT 160-4.5 MCG/ACT inhaler     . tamsulosin (FLOMAX) 0.4 MG CAPS capsule     . Tiotropium Bromide Monohydrate (SPIRIVA RESPIMAT) 2.5 MCG/ACT AERS      No current facility-administered medications for this visit.     REVIEW OF SYSTEMS:  [X]  denotes  positive finding, [ ]  denotes negative finding Cardiac  Comments:  Chest pain or chest pressure:    Shortness of breath upon exertion: x   Short of breath when lying flat:    Irregular heart rhythm:        Vascular    Pain in calf, thigh, or hip brought on by ambulation: x   Pain in feet at night that wakes you up from your sleep:     Blood clot in your veins:    Leg swelling:  x       Pulmonary    Oxygen at home: x   Productive cough:     Wheezing:         Neurologic    Sudden weakness in arms or legs:     Sudden numbness in arms or legs:     Sudden onset of difficulty speaking or slurred speech:    Temporary loss of vision in one eye:     Problems with dizziness:         Gastrointestinal    Blood in stool:     Vomited blood:         Genitourinary    Burning when urinating:     Blood in urine:        Psychiatric    Major depression:         Hematologic    Bleeding problems:    Problems with blood clotting too easily:        Skin    Rashes or ulcers:        Constitutional    Fever or chills:     PHYSICAL EXAM:   Vitals:   06/28/17 1500  BP: 129/67  Pulse: (!) 119  Resp: 20  Temp: 98.5 F (36.9 C)  TempSrc: Oral  SpO2: 90%  Weight: 186 lb 14.4 oz (84.8 kg)  Height: 5\' 8"  (1.727 m)    GENERAL: The patient is a well-nourished male, on oxygen with some mild labored breathing.. The vital signs are documented above. CARDIAC: There is a regular rate and rhythm.  VASCULAR: I do not detect carotid bruits. I cannot palpate femoral, popliteal, or pedal pulses bilaterally. He has bilateral lower extremity swelling. PULMONARY: He has very little air movement at both bases. ABDOMEN: Soft and non-tender with normal pitched bowel sounds.  MUSCULOSKELETAL: There are no major deformities or cyanosis. NEUROLOGIC: No focal weakness or paresthesias are detected. SKIN:  This patient has acrocyanosis bilaterally.  He has an extensive wound overlying his left Achilles  which measures 13 cm x 3 cm.  He has an extensive punctate wound over his right leg that measures 3 cm x 3.5 cm.  There is an additional  wound is 1 cm x 2 cm more proximally. PSYCHIATRIC: The patient has a normal affect.  DATA:    CT ANGIOGRAM ABDOMEN AORTA AND ILIOFEMORAL RUNOFF: I have reviewed the CT angiogram report that was done on 04/26/2017.  I was unable to view the images as the disc would not work on any of the computers in our office.  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  This extended through the right common iliac artery with reconstitution of the external iliac artery.  There was moderate disease in the external iliac artery on the right and disease in the common femoral artery.  The distal superficial femoral artery and popliteal artery had disease.  There was disease three-vessel runoff on the right.  Likewise on the left the aortic occlusion extended into the left common iliac artery.  There was reconstitution of the common femoral artery.  Again there was disease in the distal superficial femoral artery and popliteal artery with three-vessel runoff which was diseased.  ARTERIAL DOPPLER STUDY: I have independently interpreted his arterial Doppler study today.  On the right side he has a monophasic posterior tibial signal only.  ABI is 0.07.  On the left side he has a monophasic anterior tibial signal only.  ABI is 0.19.  BILATERAL LOWER EXTREMITY VENOUS DUPLEX: I reviewed the duplex scan that was done in November 2018.  This showed no evidence of DVT bilaterally.  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  He has disease in the distal superficial femoral artery and popliteal arteries bilaterally.  He has very extensive wounds bilaterally which are likely not salvageable even with normal circulation.  In addition  he is at very high risk for any intervention given his severe COPD on continuous O2 with labored breathing even at rest.  He also has a history of congestive heart failure.  I explained that without improve circulation the wounds on the leg will not heal and will likely progress and ultimately make him septic.  He has not a candidate for an endovascular approach given that his iliac artery occlusions extend up to the level of the inferior mesenteric artery.  Thus with an aortic occlusion this is not an option.  Certainly with his severe pulmonary history he is not a candidate for open aortofemoral bypass grafting.  Regardless given the wounds on the lower extremities he would not only require an inflow procedure but would also require bilateral infrainguinal bypasses.  Despite this even with a normal pulse the extent of the wound suggest that these wounds are likely not salvageable.  The lesser option would be an axillobifemoral bypass graft and bilateral infrainguinal bypasses however again this would be associated with a lengthy anesthesia with minimal chance that this would be successful.  Thus really I think the only option is bilateral above-the-knee amputations.  He does not want to proceed with this at this time and may not want to do this at all.  He needs to give himself some time to digest this.  If he decides to proceed and certainly we can arrange bilateral above-the-knee amputations.  Even this would be associated with significant risk given his pulmonary history especially.  In addition given his aortic occlusion there is a 20% risk of AKA is not healing.  If he had issues with this the bailout would potentially be an axillobifemoral bypass.  This  is not a good situation we have had a very lengthy discussion today.  We spent approximately 60 minutes.  Waverly Ferrari [facility] [phone]

## 2017-07-11 ENCOUNTER — Ambulatory Visit: Payer: Medicare PPO | Admitting: Internal Medicine

## 2017-09-09 DEATH — deceased

## 2018-07-10 IMAGING — DX DG CHEST 2V
2 series · 2 of 2 positions shown · non-contrast
Comparison: None.

CLINICAL DATA: Chronic dyspnea.  History of COPD.

EXAM:
CHEST  2 VIEW

[chest pa]
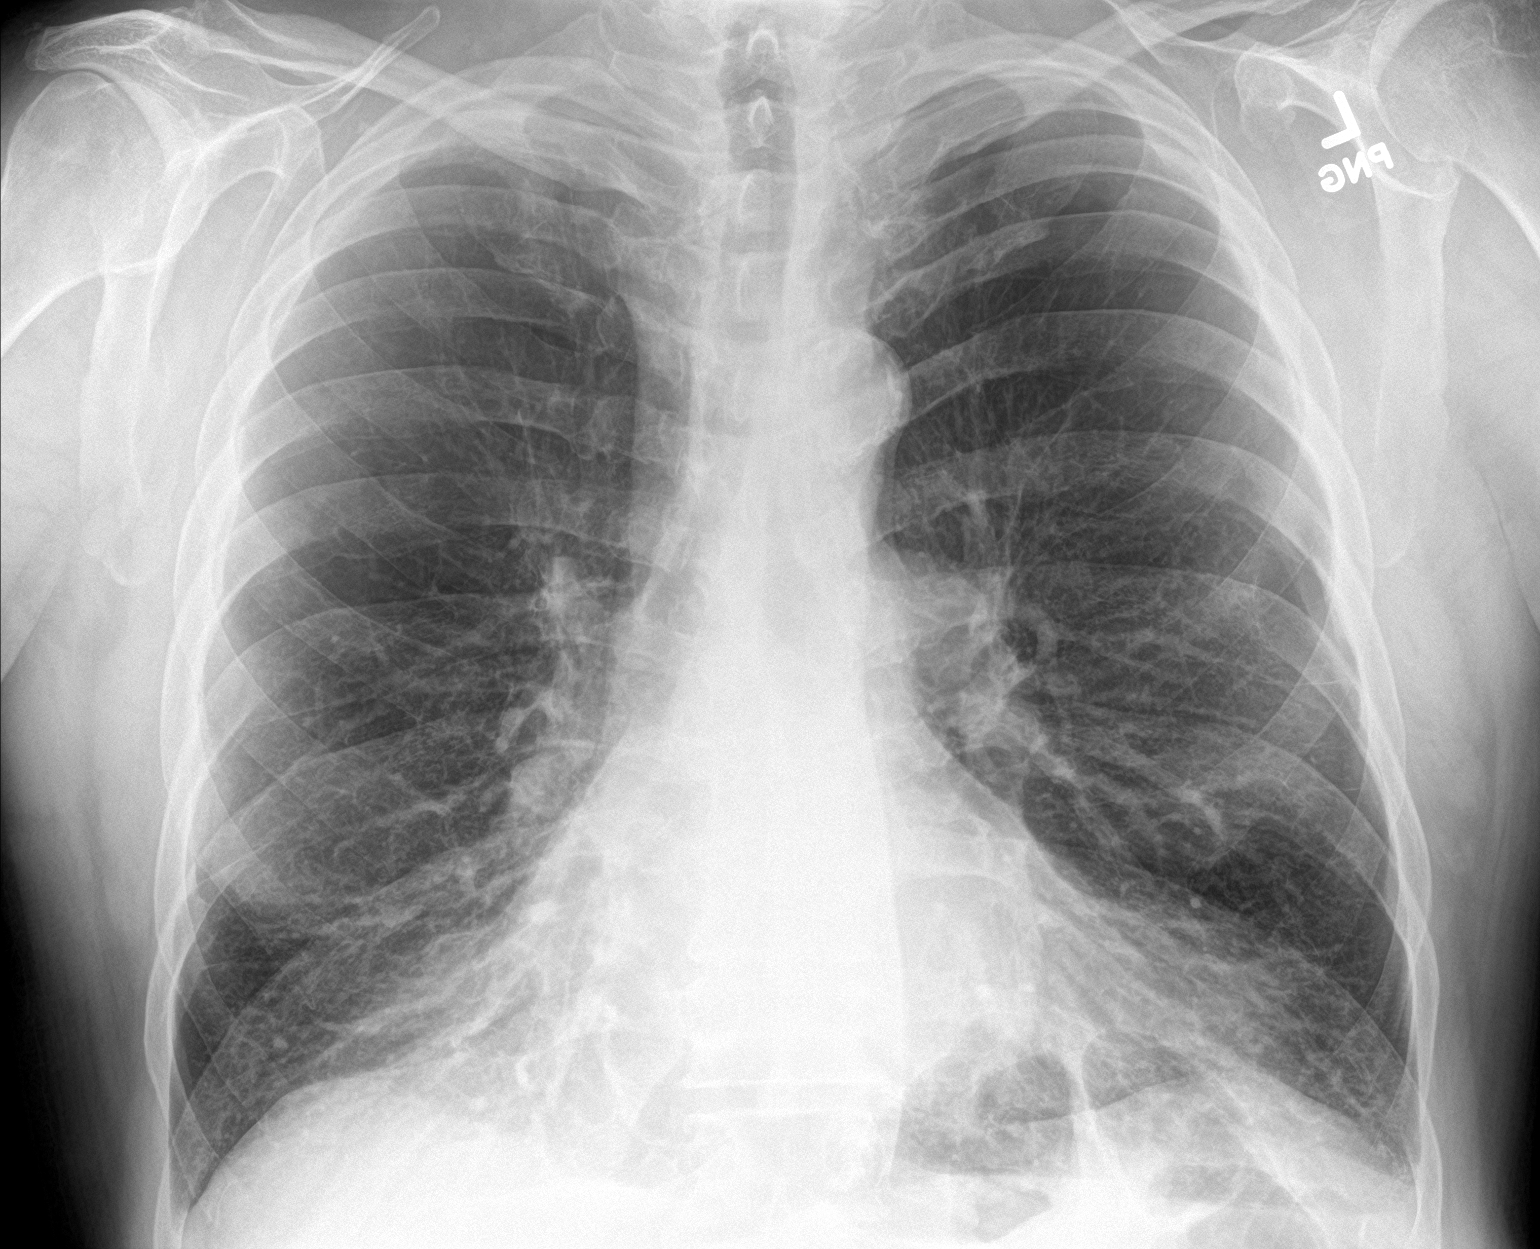

[chest lat]
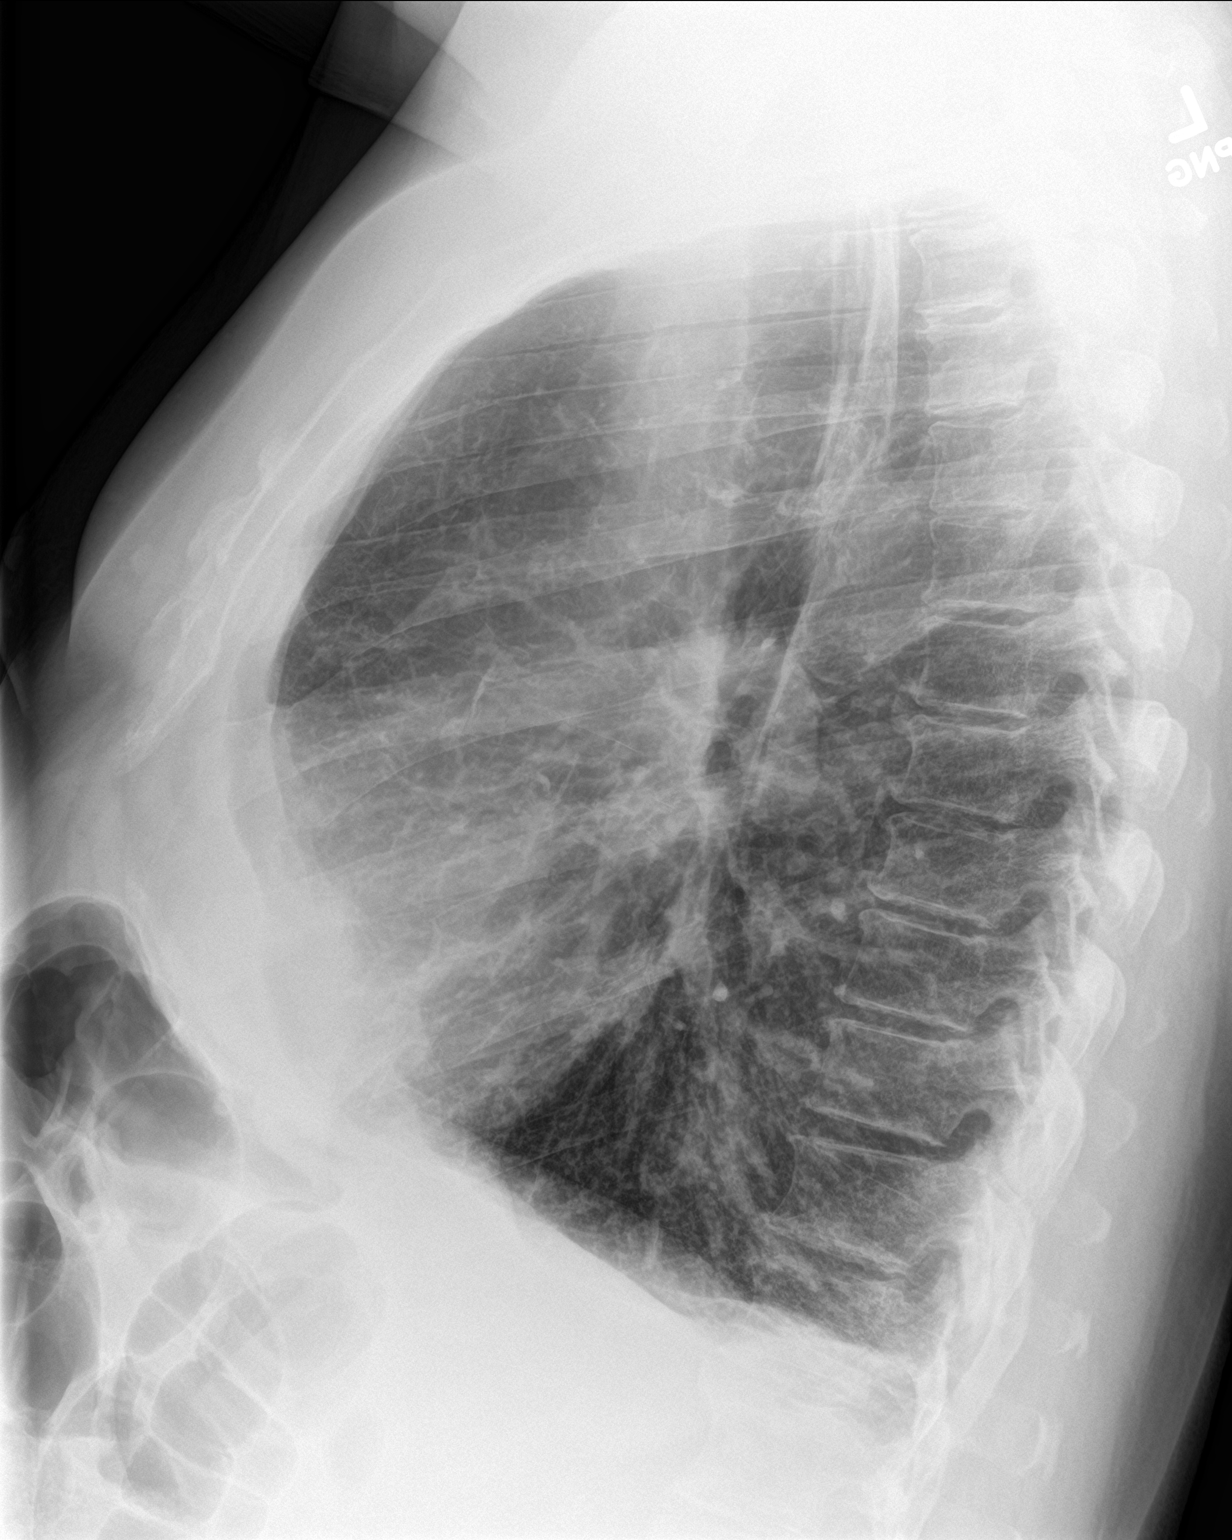

[2 of 2 positions shown; findings below may reference images not displayed]

FINDINGS: Cardiomediastinal silhouette is normal, calcified aortic knob.
Increased lung volumes with flattened hemidiaphragms and
interstitial prominence in the lung bases. Tiny scattered nodular
densities. No pleural effusion or focal consolidation. No
pneumothorax. Soft tissue planes and included osseous structures are
nonsuspicious.
IMPRESSION: COPD with bibasilar suspected fibrosis.

Scattered tiny pulmonary nodules or granulomata.

For constellation of findings, recommend CT chest on a nonemergent
basis.

## 2018-07-22 IMAGING — CT CT CHEST HIGH RESOLUTION W/O CM
2 of 6 series · 14 of 36 positions shown, 17 images · non-contrast
Comparison: 08/11/2016 chest radiograph.

CLINICAL DATA: COPD. [employer] pneumoconiosis. Histoplasmosis.
Dyspnea.

EXAM:
CT CHEST WITHOUT CONTRAST
TECHNIQUE: Multidetector CT imaging of the chest was performed following the
standard protocol without intravenous contrast. High resolution
imaging of the lungs, as well as inspiratory and expiratory imaging,
was performed.

[Series 7: coronal · coronal · 0.72mm/px · 3 of 123 slices shown]
[im 25/123  lung]
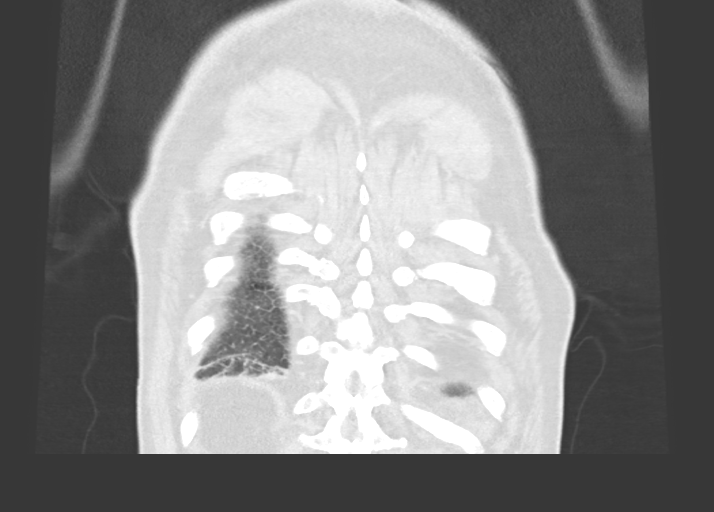
[im 49/123  lung]
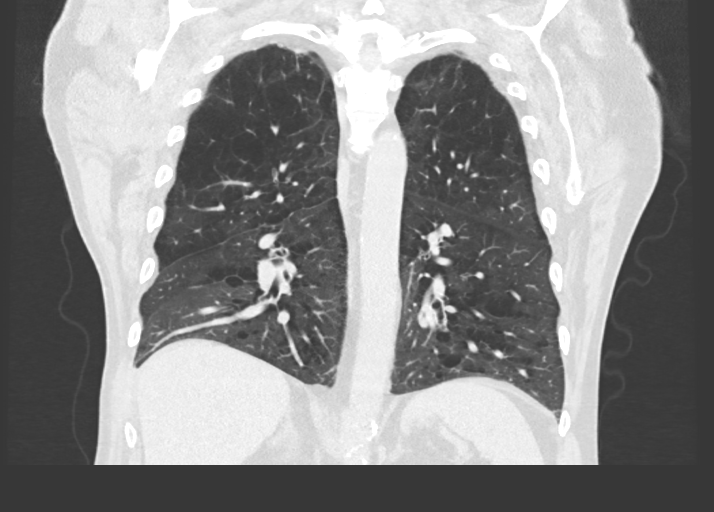
[im 74/123  lung]
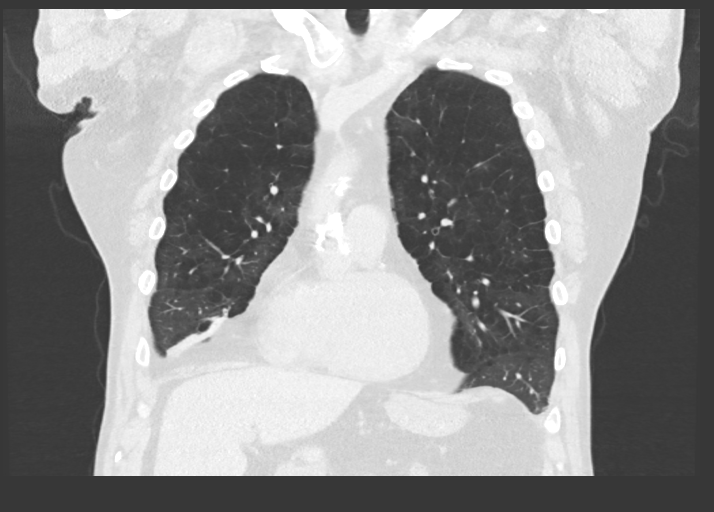

[Series 9: super d · axial · 0.79mm/px · z∈[+1407,+1701]mm · 11 of 334 slices shown, 14 images]
[im 20/334  mediastinal]
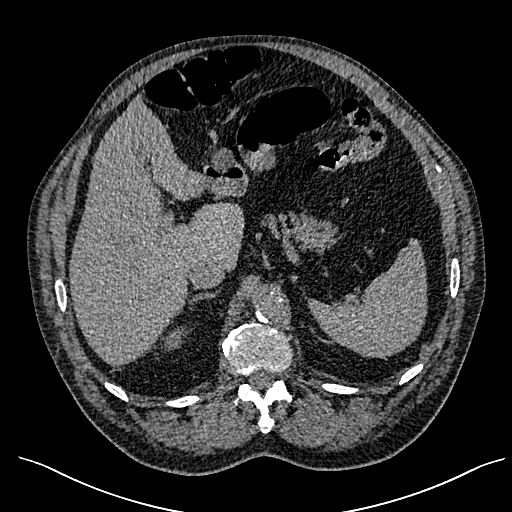
[im 20/334  lung]
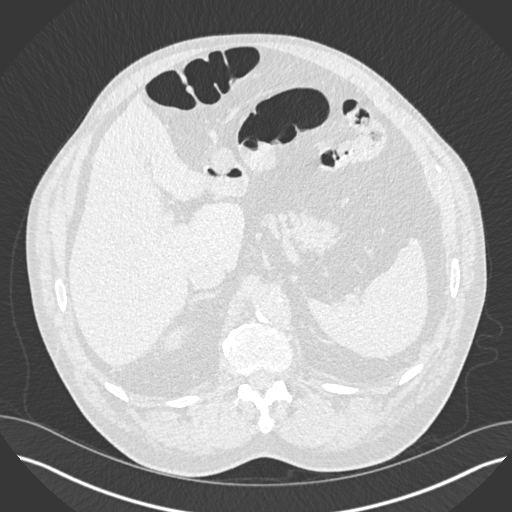
[im 59/334  lung]
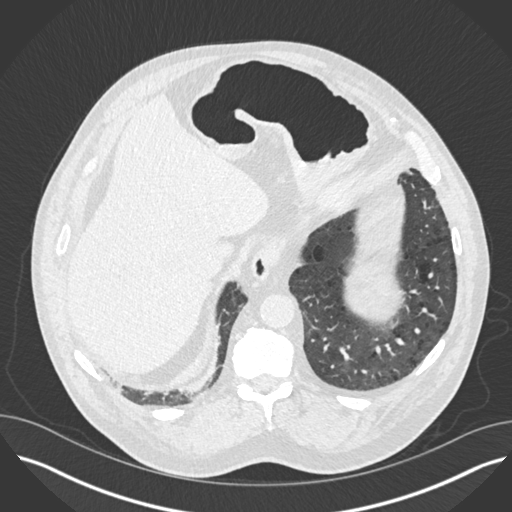
[im 79/334  lung]
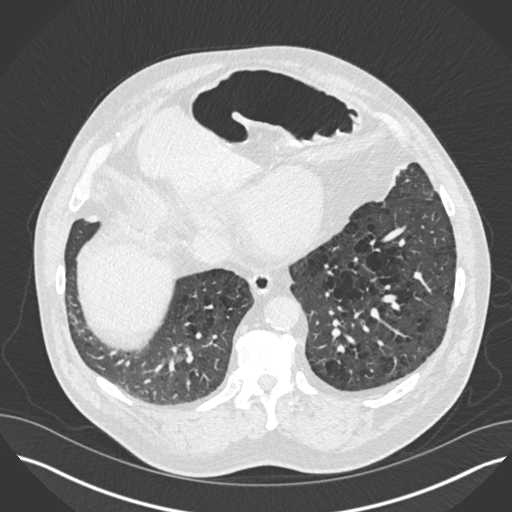
[im 118/334  lung]
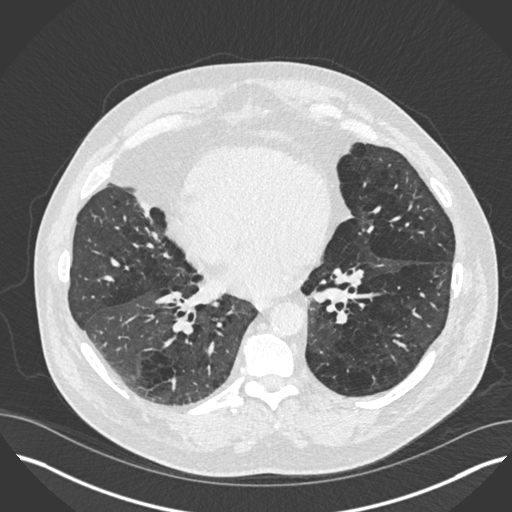
[im 138/334  mediastinal]
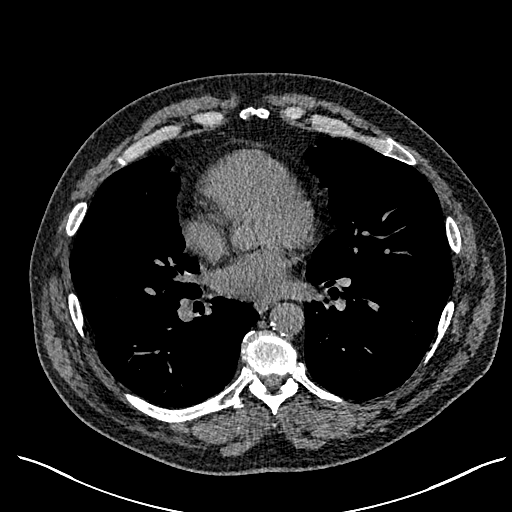
[im 138/334  lung]
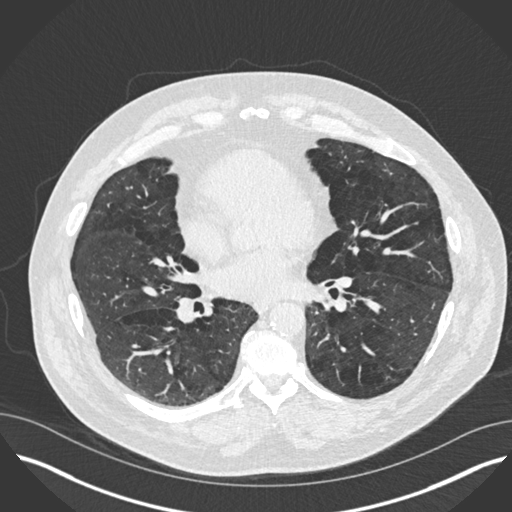
[im 177/334  lung]
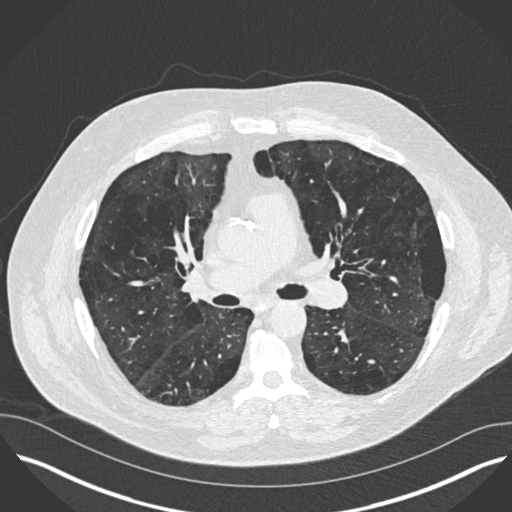
[im 196/334  lung]
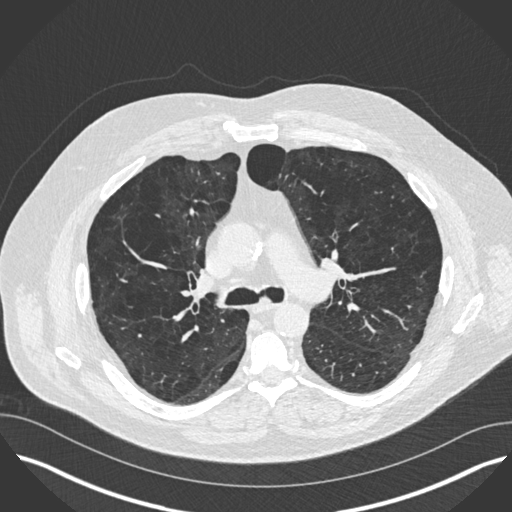
[im 216/334  lung]
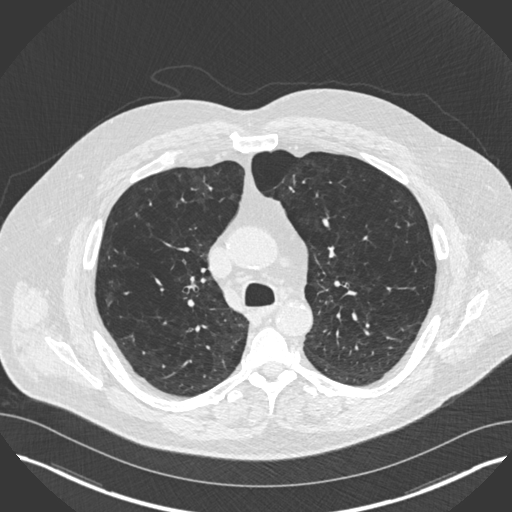
[im 255/334  mediastinal]
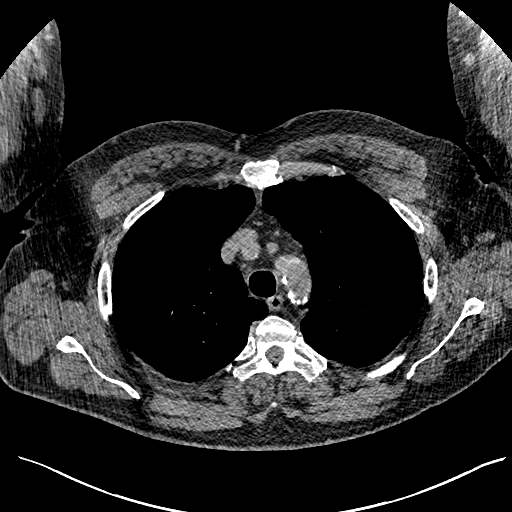
[im 255/334  lung]
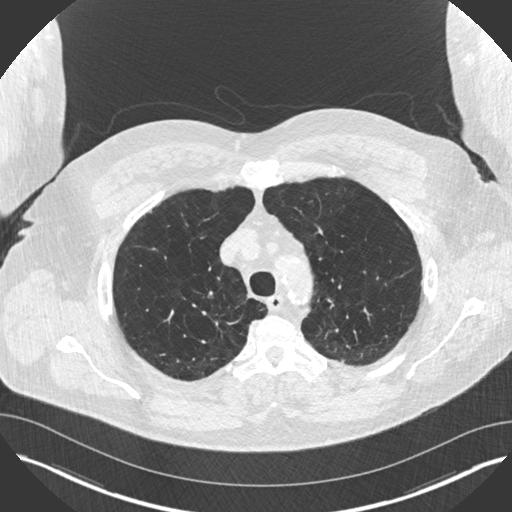
[im 275/334  lung]
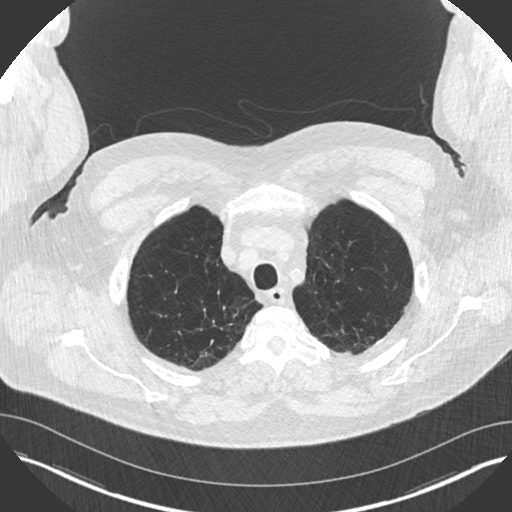
[im 314/334  lung]
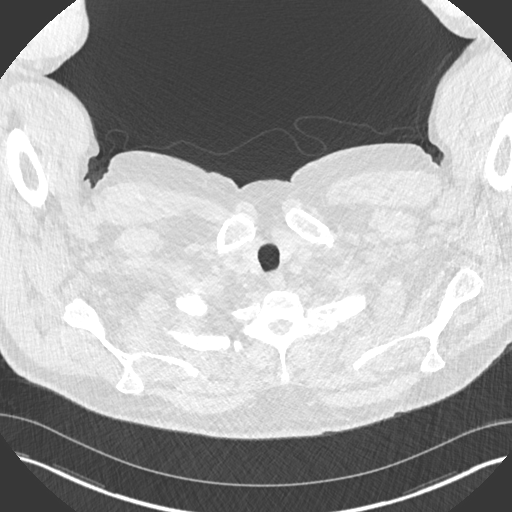

[14 of 36 positions shown; findings below may reference images not displayed]

FINDINGS: Cardiovascular: Normal heart size. No significant pericardial
fluid/thickening. Left main, left anterior descending, left
circumflex and right coronary atherosclerosis. Atherosclerotic
nonaneurysmal thoracic aorta. Top-normal caliber pulmonary arteries
(main pulmonary artery diameter 3.0 cm).

Mediastinum/Nodes: No discrete thyroid nodules. Unremarkable
esophagus. No axillary adenopathy. Mildly enlarged 1.0 cm left
paratracheal node (series 2/ image 55). No additional pathologically
enlarged mediastinal or gross hilar nodes on this noncontrast scan.

Lungs/Pleura: No pneumothorax. No pleural effusion. Severe
centrilobular and paraseptal emphysema with diffuse bronchial wall
thickening. No acute consolidative airspace disease, lung masses or
significant pulmonary nodules. There are a few scattered parenchymal
bands in the mid to lower lungs bilaterally compatible with mild
postinfectious/ postinflammatory scarring. No significant regions of
subpleural reticulation, ground-glass attenuation, traction
bronchiectasis, architectural distortion or frank honeycombing. No
evidence of lobular air trapping on the expiration sequence.

Upper abdomen: Unremarkable.

Musculoskeletal: No aggressive appearing focal osseous lesions. Mild
thoracic spondylosis.
IMPRESSION: 1. Severe emphysema and diffuse bronchial wall thickening,
compatible with the provided history of COPD. No acute pulmonary
disease.
2. No evidence of interstitial lung disease. Scattered parenchymal
bands in the mid to lower lungs bilaterally, compatible with mild
postinfectious/postinflammatory scarring.
3. Mild mediastinal lymphadenopathy, nonspecific and probably
reactive.
4. Aortic atherosclerosis. Left main and 3 vessel coronary
atherosclerosis.
